# Patient Record
Sex: Female | Born: 1970 | Hispanic: Yes | Marital: Married | Smoking: Never smoker
Health system: Southern US, Community
[De-identification: ages and names within clinical notes are randomized; demographics above are authoritative.]

## PROBLEM LIST (undated history)

## (undated) DIAGNOSIS — I10 Essential (primary) hypertension: Secondary | ICD-10-CM

---

## 2003-12-10 ENCOUNTER — Other Ambulatory Visit: Payer: Self-pay

## 2009-10-05 ENCOUNTER — Emergency Department: Payer: Self-pay | Admitting: Emergency Medicine

## 2010-06-09 ENCOUNTER — Ambulatory Visit: Payer: Self-pay | Admitting: Internal Medicine

## 2010-07-12 ENCOUNTER — Ambulatory Visit: Payer: Self-pay | Admitting: Internal Medicine

## 2011-07-05 ENCOUNTER — Ambulatory Visit: Payer: Self-pay | Admitting: Internal Medicine

## 2011-07-21 ENCOUNTER — Ambulatory Visit: Payer: Self-pay | Admitting: Internal Medicine

## 2012-05-01 IMAGING — US US PELV - US TRANSVAGINAL
1 series · 17 of 25 positions shown · non-contrast
Comparison: none

REASON FOR EXAM: irregular menses
COMMENTS:

[Series 1: us pelv - us transvaginal · 17 of 112 slices shown]
[im 1/112]
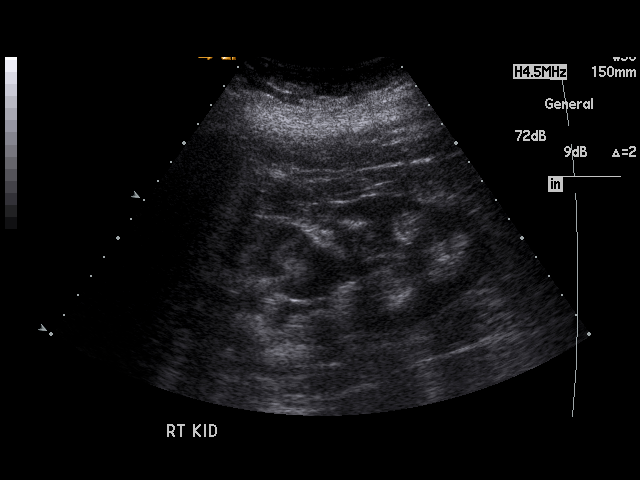
[im 10/112]
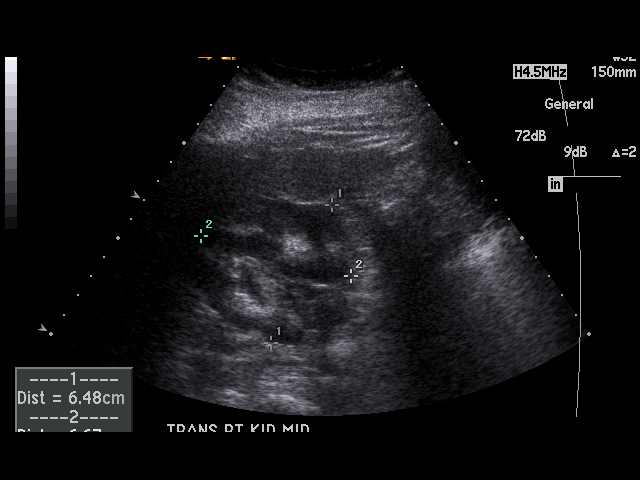
[im 14/112]
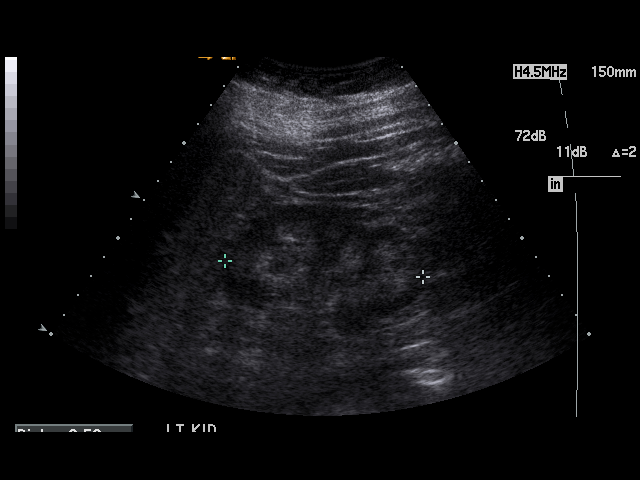
[im 24/112]
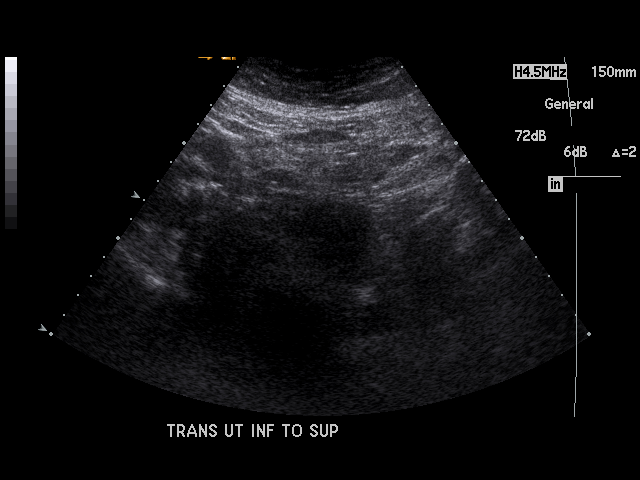
[im 28/112]
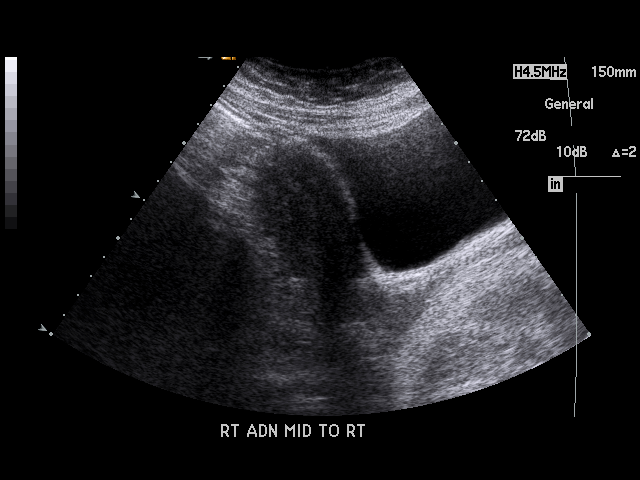
[im 38/112]
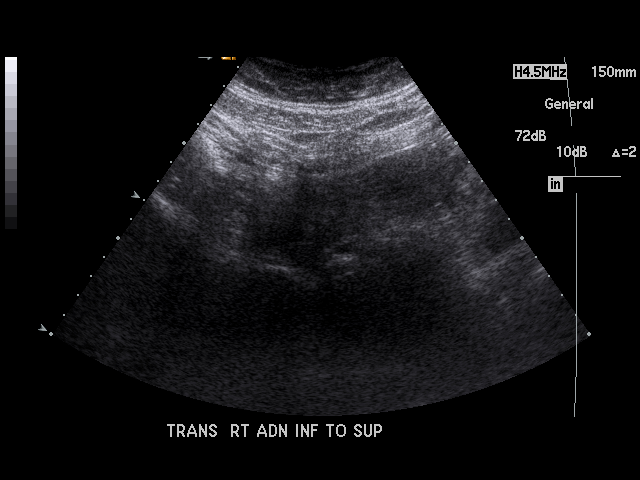
[im 42/112]
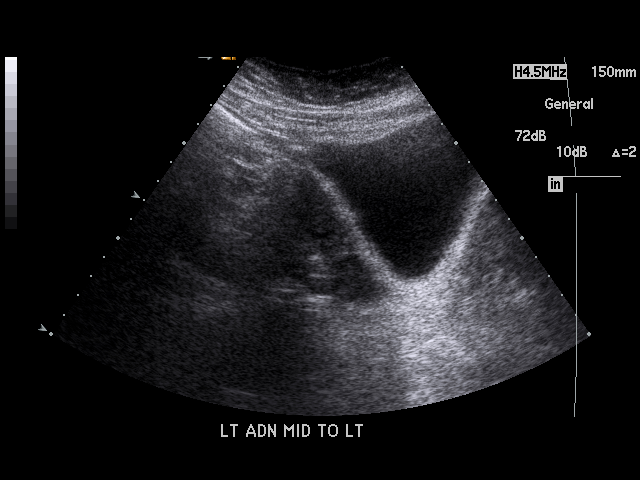
[im 51/112]
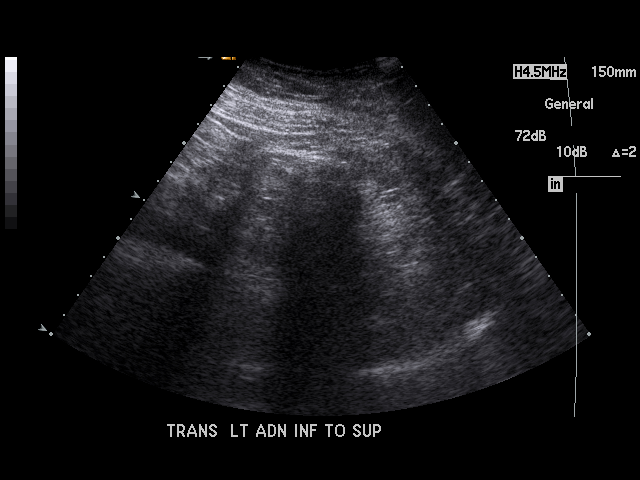
[im 56/112]
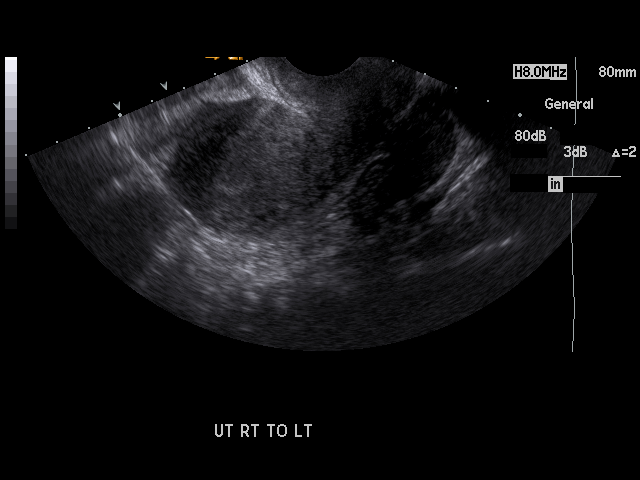
[im 61/112]
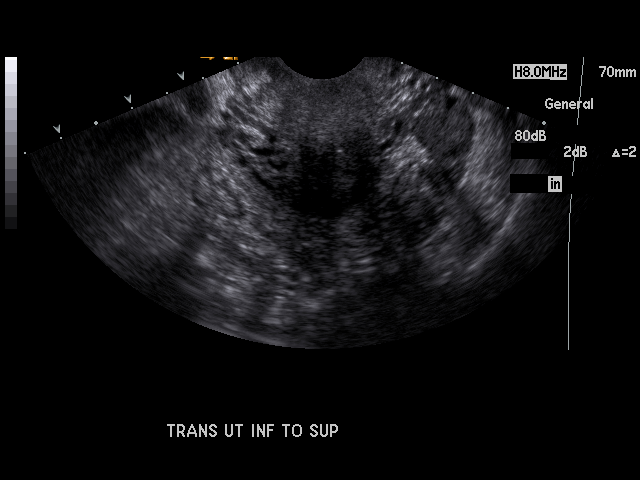
[im 70/112]
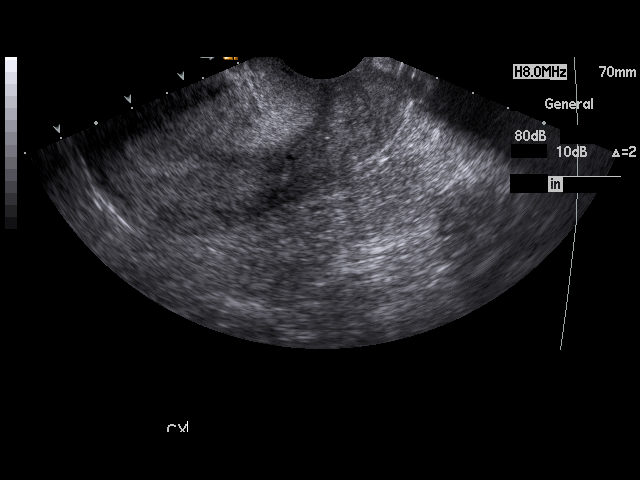
[im 75/112]
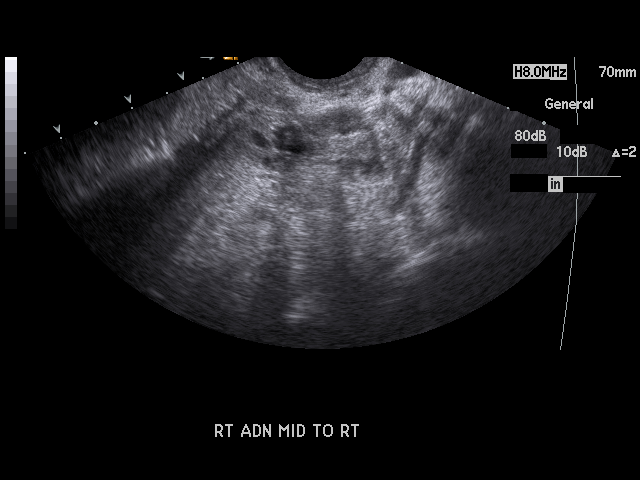
[im 84/112]
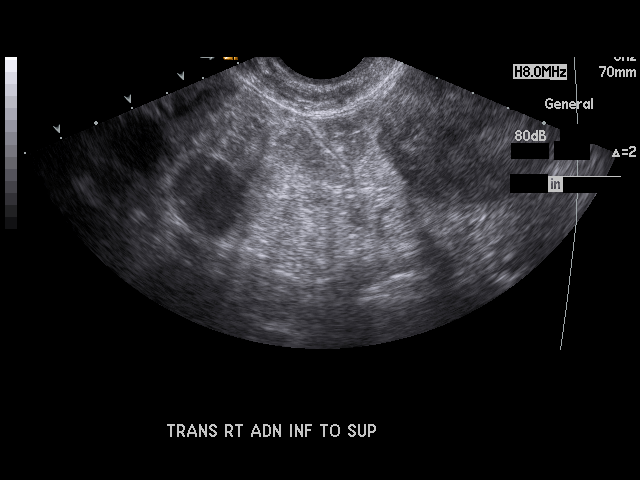
[im 88/112]
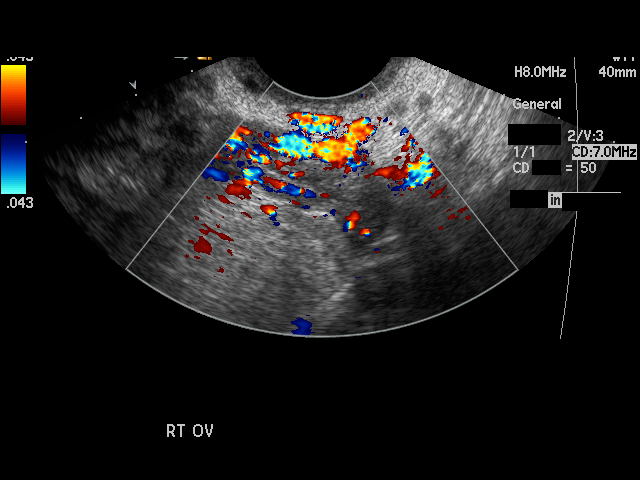
[im 98/112]
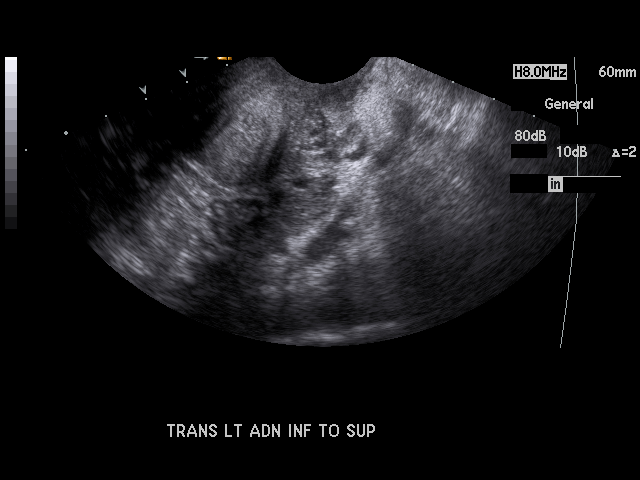
[im 102/112]
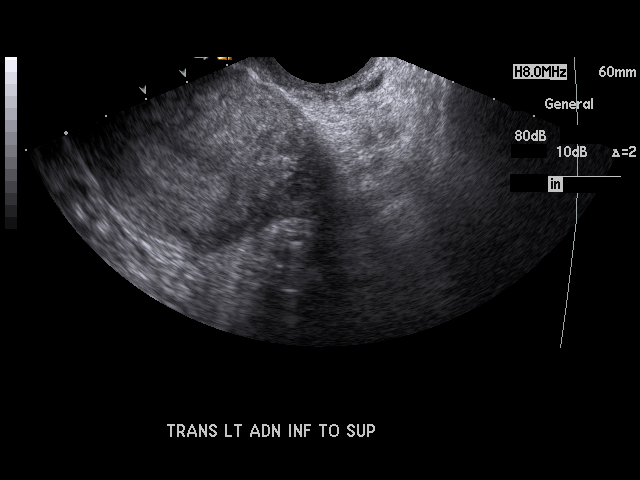
[im 112/112]
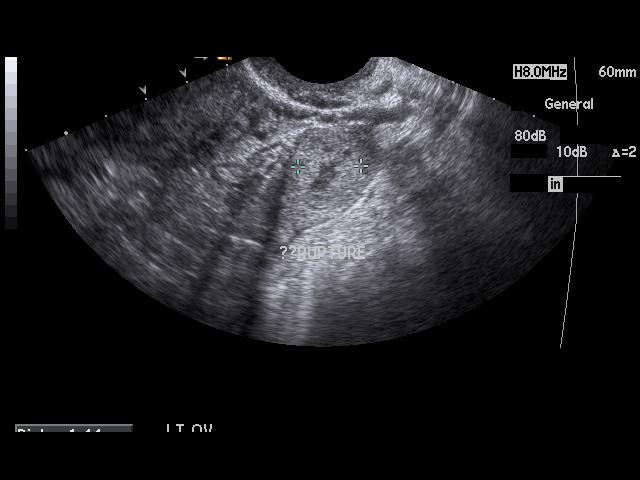

[17 of 25 positions shown; findings below may reference images not displayed]

PROCEDURE:     US  - US PELVIS EXAM W/TRANSVAGINAL  - July 21, 2011  [DATE]

RESULT:     Pelvic Sonogram shows the uterus measures 9.75 x 4.51 x 6.24 cm.
The endometrium measures 6.8 mm. The right ovary measures 2.9 x 1.4 x 2.0 cm
while the left measures 1.9 x 4.2 x 2.2 cm. The left ovary demonstrates an
irregularly marginated, hypoechoic area with increased flow peripherally
possibly representing a recently ruptured or hemorrhagic ruptured cyst. Both
kidneys show cortical thinning without obstruction. Correlate clinically.
IMPRESSION: 1.  Possibly ruptured cyst in the left ovary. Follow-up in 6 to 8 weeks
could be performed to document resolution.
2.  Probable chronic renal disease. Correlate clinically.
3.  Unremarkable uterus.

## 2012-12-10 ENCOUNTER — Ambulatory Visit: Payer: Self-pay | Admitting: Internal Medicine

## 2015-09-07 ENCOUNTER — Other Ambulatory Visit: Payer: Self-pay | Admitting: Internal Medicine

## 2015-09-07 DIAGNOSIS — Z1231 Encounter for screening mammogram for malignant neoplasm of breast: Secondary | ICD-10-CM

## 2015-09-16 ENCOUNTER — Ambulatory Visit: Payer: Self-pay

## 2015-09-16 ENCOUNTER — Ambulatory Visit
Admission: RE | Admit: 2015-09-16 | Discharge: 2015-09-16 | Disposition: A | Discharge status: Home / Self Care | Payer: BLUE CROSS/BLUE SHIELD | Source: Ambulatory Visit | Attending: Internal Medicine | Admitting: Internal Medicine

## 2015-09-16 DIAGNOSIS — Z1231 Encounter for screening mammogram for malignant neoplasm of breast: Secondary | ICD-10-CM | POA: Diagnosis not present

## 2016-10-27 ENCOUNTER — Other Ambulatory Visit: Payer: Self-pay | Admitting: Internal Medicine

## 2016-10-27 DIAGNOSIS — Z1231 Encounter for screening mammogram for malignant neoplasm of breast: Secondary | ICD-10-CM

## 2016-11-23 ENCOUNTER — Ambulatory Visit
Admission: RE | Admit: 2016-11-23 | Discharge: 2016-11-23 | Disposition: A | Discharge status: Home / Self Care | Payer: BLUE CROSS/BLUE SHIELD | Source: Ambulatory Visit | Attending: Internal Medicine | Admitting: Internal Medicine

## 2016-11-23 DIAGNOSIS — Z1231 Encounter for screening mammogram for malignant neoplasm of breast: Secondary | ICD-10-CM | POA: Diagnosis not present

## 2019-09-02 ENCOUNTER — Telehealth: Payer: Self-pay | Admitting: Obstetrics & Gynecology

## 2019-09-02 NOTE — Telephone Encounter (Signed)
Called and left voice mail for patient to call back to be schedule °

## 2019-09-03 NOTE — Telephone Encounter (Signed)
Pam James referring for BLT consultation. Voicemail not set up unable to leave message

## 2019-10-10 ENCOUNTER — Other Ambulatory Visit: Payer: Self-pay | Admitting: Internal Medicine

## 2019-10-10 DIAGNOSIS — Z1231 Encounter for screening mammogram for malignant neoplasm of breast: Secondary | ICD-10-CM

## 2019-10-29 ENCOUNTER — Ambulatory Visit
Admission: RE | Admit: 2019-10-29 | Discharge: 2019-10-29 | Disposition: A | Discharge status: Home / Self Care | Payer: BLUE CROSS/BLUE SHIELD | Source: Ambulatory Visit | Attending: Internal Medicine | Admitting: Internal Medicine

## 2019-10-29 DIAGNOSIS — Z1231 Encounter for screening mammogram for malignant neoplasm of breast: Secondary | ICD-10-CM | POA: Insufficient documentation

## 2019-12-11 ENCOUNTER — Other Ambulatory Visit: Payer: BLUE CROSS/BLUE SHIELD

## 2021-01-05 ENCOUNTER — Other Ambulatory Visit: Payer: Self-pay | Admitting: Internal Medicine

## 2021-01-18 ENCOUNTER — Other Ambulatory Visit: Payer: Self-pay | Admitting: Internal Medicine

## 2021-02-10 ENCOUNTER — Other Ambulatory Visit: Payer: Self-pay | Admitting: Internal Medicine

## 2021-02-10 DIAGNOSIS — Z1231 Encounter for screening mammogram for malignant neoplasm of breast: Secondary | ICD-10-CM

## 2021-05-06 ENCOUNTER — Other Ambulatory Visit: Payer: Self-pay | Admitting: Family Medicine

## 2021-05-06 DIAGNOSIS — Z1231 Encounter for screening mammogram for malignant neoplasm of breast: Secondary | ICD-10-CM

## 2021-05-18 ENCOUNTER — Other Ambulatory Visit: Payer: Self-pay

## 2021-05-18 ENCOUNTER — Ambulatory Visit
Admission: RE | Admit: 2021-05-18 | Discharge: 2021-05-18 | Disposition: A | Discharge status: Home / Self Care | Payer: BLUE CROSS/BLUE SHIELD | Source: Ambulatory Visit | Attending: Family Medicine | Admitting: Family Medicine

## 2021-05-18 DIAGNOSIS — Z1231 Encounter for screening mammogram for malignant neoplasm of breast: Secondary | ICD-10-CM | POA: Insufficient documentation

## 2022-02-27 IMAGING — MG MM DIGITAL SCREENING BILAT W/ TOMO AND CAD
8 series · 8 of 24 positions shown · non-contrast
Comparison: Previous exam(s).

CLINICAL DATA: Screening.

EXAM:
DIGITAL SCREENING BILATERAL MAMMOGRAM WITH TOMOSYNTHESIS AND CAD
TECHNIQUE: Bilateral screening digital craniocaudal and mediolateral oblique
mammograms were obtained. Bilateral screening digital breast
tomosynthesis was performed. The images were evaluated with
computer-aided detection.

[L CC synth-2D]
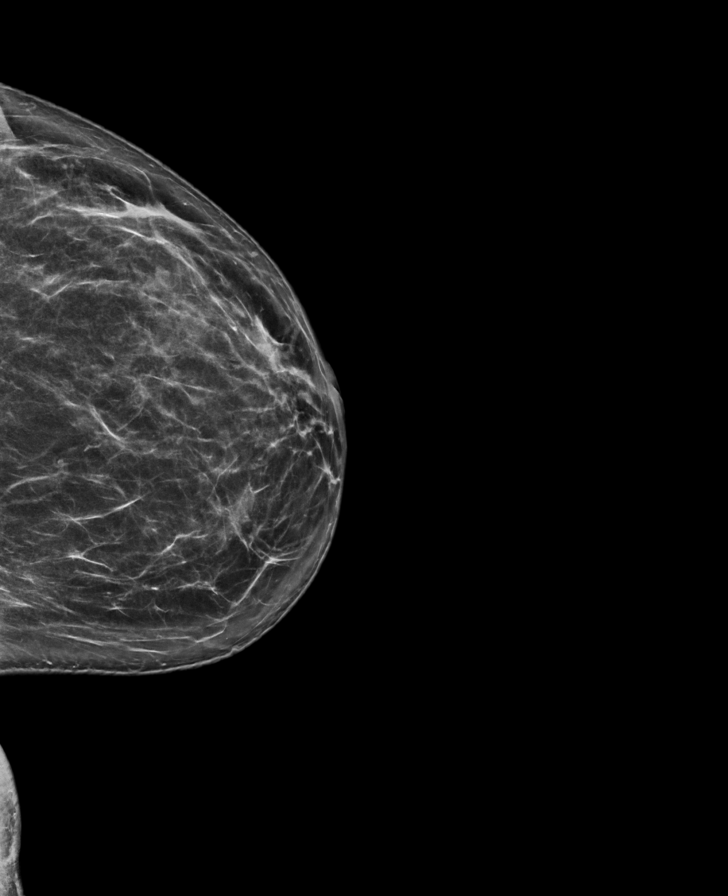

[L MLO synth-2D]
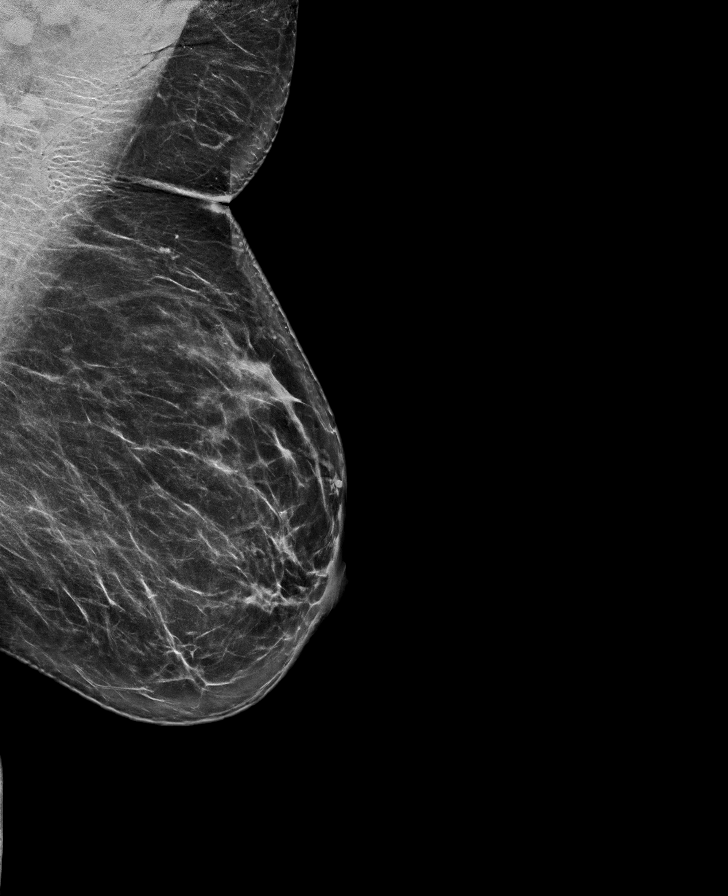

[R MLO synth-2D]
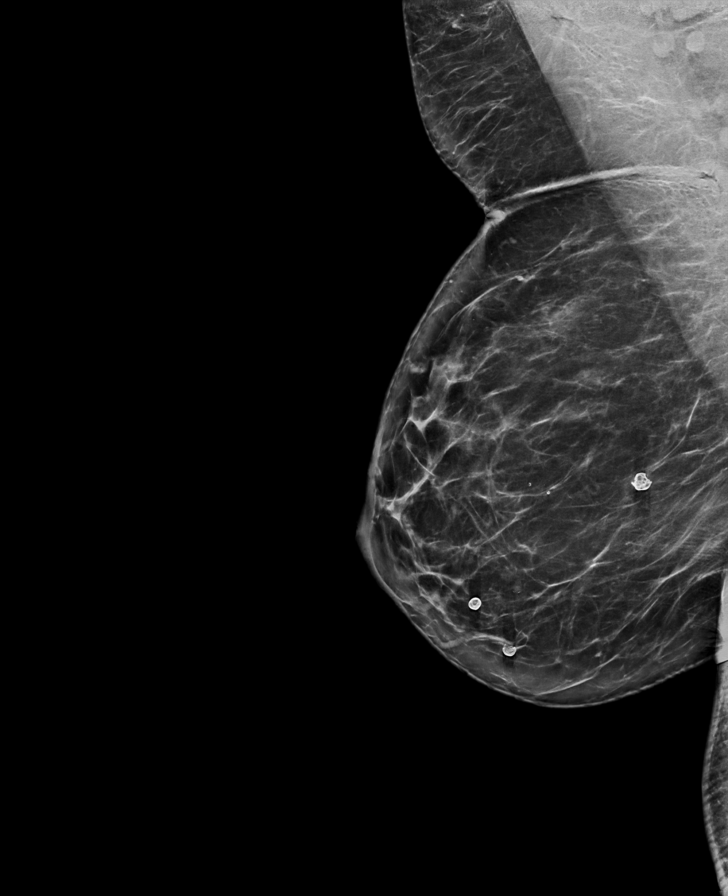

[R CC synth-2D]
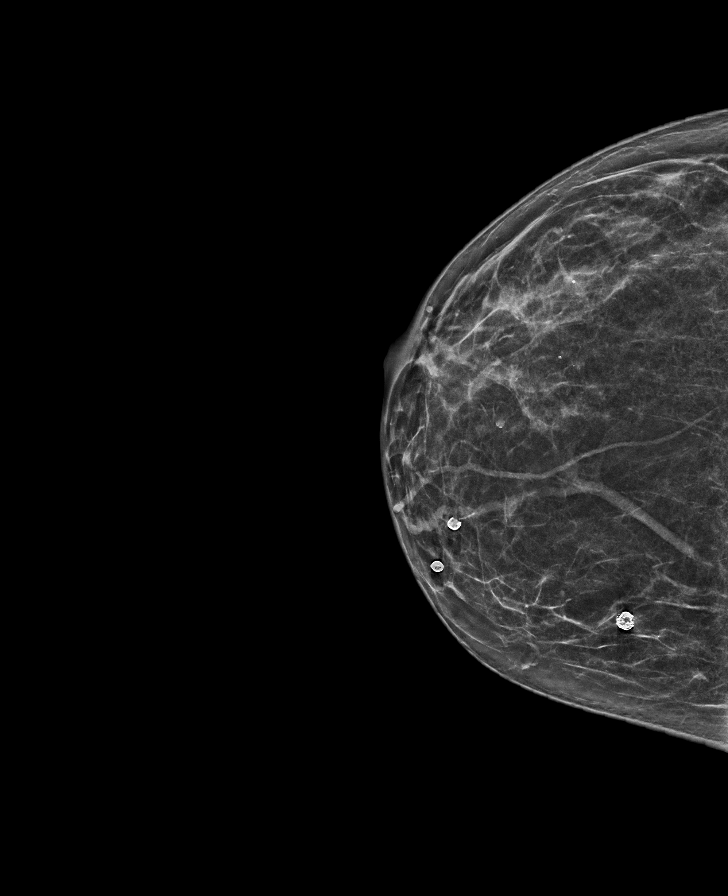

[R CC tomo · tomo slice 31/60.0]
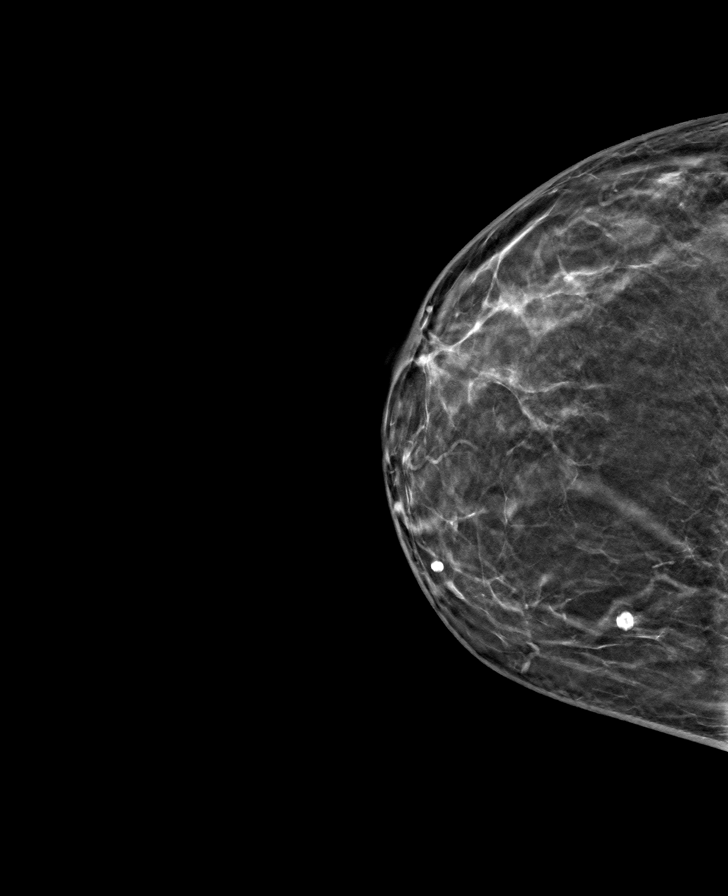

[R MLO tomo · tomo slice 41/80.0]
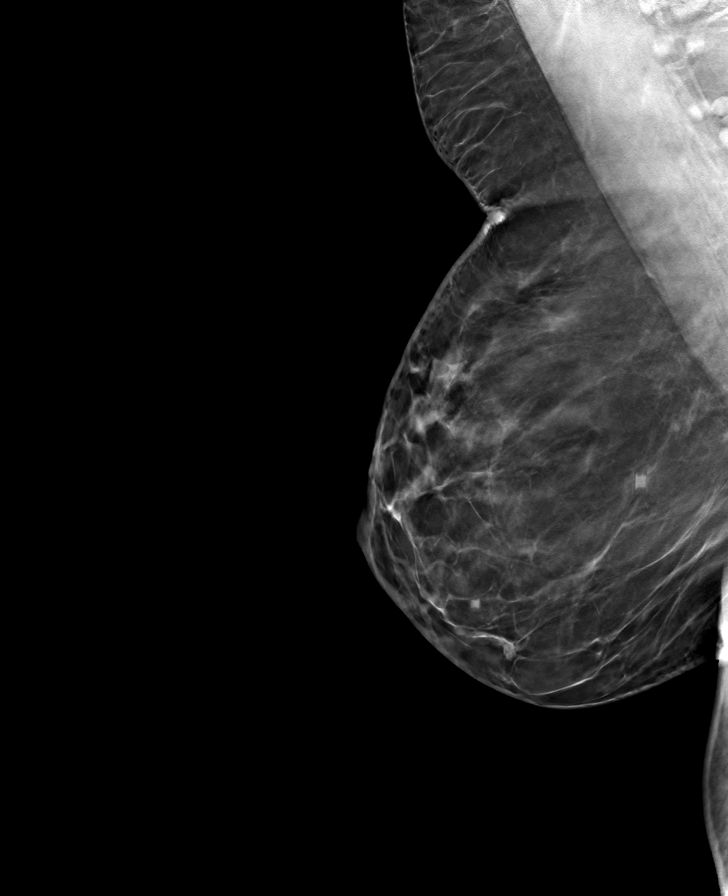

[L CC tomo · tomo slice 35/69.0]
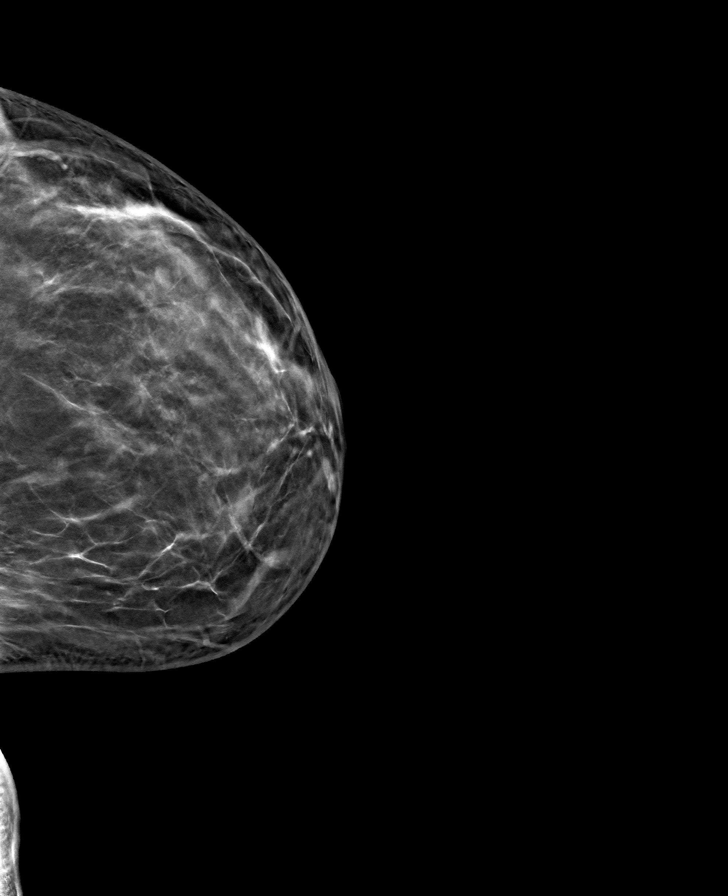

[L MLO tomo · tomo slice 41/82.0]
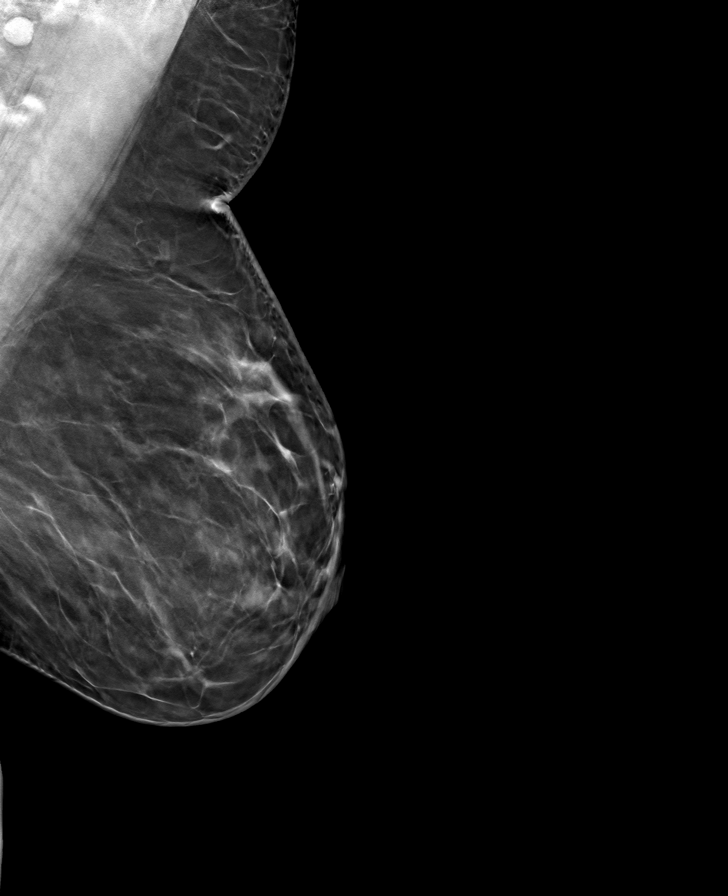

[8 of 24 positions shown; findings below may reference images not displayed]

ACR Breast Density Category b: There are scattered areas of
fibroglandular density.
FINDINGS: There are no findings suspicious for malignancy.
IMPRESSION: No mammographic evidence of malignancy. A result letter of this
screening mammogram will be mailed directly to the patient.

RECOMMENDATION:
Screening mammogram in one year. (Code:51-O-LD2)

BI-RADS CATEGORY  1: Negative.

## 2023-03-20 ENCOUNTER — Other Ambulatory Visit: Payer: Self-pay | Admitting: Primary Care

## 2023-03-20 DIAGNOSIS — Z1231 Encounter for screening mammogram for malignant neoplasm of breast: Secondary | ICD-10-CM

## 2023-03-23 ENCOUNTER — Ambulatory Visit
Admission: RE | Admit: 2023-03-23 | Discharge: 2023-03-23 | Disposition: A | Discharge status: Home / Self Care | Payer: BC Managed Care – PPO | Source: Ambulatory Visit | Attending: Primary Care | Admitting: Primary Care

## 2023-03-23 DIAGNOSIS — Z1231 Encounter for screening mammogram for malignant neoplasm of breast: Secondary | ICD-10-CM | POA: Insufficient documentation

## 2024-01-25 ENCOUNTER — Other Ambulatory Visit: Payer: Self-pay | Admitting: Family Medicine

## 2024-01-25 DIAGNOSIS — Z1231 Encounter for screening mammogram for malignant neoplasm of breast: Secondary | ICD-10-CM

## 2024-04-04 ENCOUNTER — Emergency Department

## 2024-04-04 ENCOUNTER — Other Ambulatory Visit: Payer: Self-pay

## 2024-04-04 ENCOUNTER — Emergency Department
Admission: EM | Admit: 2024-04-04 | Discharge: 2024-04-04 | Disposition: A | Discharge status: Home / Self Care | Attending: Emergency Medicine | Admitting: Emergency Medicine

## 2024-04-04 ENCOUNTER — Encounter: Payer: Self-pay | Admitting: Emergency Medicine

## 2024-04-04 DIAGNOSIS — R0789 Other chest pain: Secondary | ICD-10-CM | POA: Insufficient documentation

## 2024-04-04 DIAGNOSIS — I10 Essential (primary) hypertension: Secondary | ICD-10-CM | POA: Insufficient documentation

## 2024-04-04 DIAGNOSIS — R519 Headache, unspecified: Secondary | ICD-10-CM | POA: Diagnosis present

## 2024-04-04 HISTORY — DX: Essential (primary) hypertension: I10

## 2024-04-04 LAB — BASIC METABOLIC PANEL WITH GFR
Anion gap: 10 (ref 5–15)
BUN: 20 mg/dL (ref 6–20)
CO2: 24 mmol/L (ref 22–32)
Calcium: 8.8 mg/dL — ABNORMAL LOW (ref 8.9–10.3)
Chloride: 102 mmol/L (ref 98–111)
Creatinine, Ser: 0.82 mg/dL (ref 0.44–1.00)
GFR, Estimated: >60 mL/min (ref >60)
Glucose, Bld: 129 mg/dL — ABNORMAL HIGH (ref 70–99)
Potassium: 3.3 mmol/L — ABNORMAL LOW (ref 3.5–5.1)
Sodium: 136 mmol/L (ref 135–145)

## 2024-04-04 LAB — CBC
HCT: 37.9 % (ref 36.0–46.0)
Hemoglobin: 12.4 g/dL (ref 12.0–15.0)
MCH: 29 pg (ref 26.0–34.0)
MCHC: 32.7 g/dL (ref 30.0–36.0)
MCV: 88.6 fL (ref 80.0–100.0)
Platelets: 299 10*3/uL (ref 150–400)
RBC: 4.28 MIL/uL (ref 3.87–5.11)
RDW: 12.3 % (ref 11.5–15.5)
WBC: 9.9 10*3/uL (ref 4.0–10.5)
nRBC: 0 % (ref 0.0–0.2)

## 2024-04-04 LAB — TROPONIN I (HIGH SENSITIVITY): Troponin I (High Sensitivity): 9 ng/L (ref <18)

## 2024-04-04 NOTE — ED Notes (Signed)
Forbach MD at bedside. 

## 2024-04-04 NOTE — ED Triage Notes (Signed)
 Patient ambulatory to triage with steady gait, without difficulty or distress noted; pt reports elevated BP at home (173/100), generalized HA, mid CP and left arm "heaviness"; st seen by PCP recently for same and was given a referral to cardiology but not available til next month

## 2024-04-04 NOTE — ED Provider Notes (Signed)
 [facility] Provider Note    Event Date/Time   First MD Initiated Contact with Patient 04/04/24 0254     (approximate)   History   Hypertension  The patient and/or family speak(s) Spanish.  They understand they have the right to the use of a hospital interpreter, however at this time they prefer to speak directly with me in Spanish.  They know that they can ask for an interpreter at any time.   HPI Pam James is a 53 y.o. female with history of hypertension who presents with concerns about persistent hypertension and intermittent discomfort in her chest.  She is on 4 different blood pressure medications, and her doctor recently changed her off of amlodipine onto a different one.  She said that despite this her blood pressure is variable, sometimes as high as 190 and sometimes down in the 140s.  When the pressure gets high, she sometimes develops a headache and sometimes she feels like she can hear her pulse in her ears.  She does not have chest pain per se, but sometimes feels a little bit uncomfortable.  No shortness of breath.  No numbness or weakness in her extremities.  She has spoken with her primary care doctor and they referred her to cardiology at [facility], but her appointment is not for another month, and she was concerned something might need to be done right now.     Physical Exam   Triage Vital Signs: ED Triage Vitals  Encounter Vitals Group     BP 04/04/24 0231 (!) 178/70     Systolic BP Percentile --      Diastolic BP Percentile --      Pulse Rate 04/04/24 0231 92     Resp 04/04/24 0231 20     Temp 04/04/24 0231 98 F (36.7 C)     Temp Source 04/04/24 0231 Oral     SpO2 04/04/24 0231 99 %     Weight 04/04/24 0227 106.6 kg (235 lb)     Height 04/04/24 0227 1.702 m (5\' 7" )     Head Circumference --      Peak Flow --      Pain Score 04/04/24 0227 6     Pain Loc --      Pain Education --      Exclude from Growth Chart --      Most recent vital signs: Vitals:   04/04/24 0231 04/04/24 0300  BP: (!) 178/70 (!) 154/64  Pulse: 92   Resp: 20 16  Temp: 98 F (36.7 C)   SpO2: 99%     General: Awake, no distress.  Well-appearing, conversant. CV:  Good peripheral perfusion.  Normal heart sounds, regular rate and rhythm. Resp:  Normal effort. Speaking easily and comfortably, no accessory muscle usage nor intercostal retractions.  Lungs clear to auscultation bilaterally. Abd:  No distention.    ED Results / Procedures / Treatments   Labs (all labs ordered are listed, but only abnormal results are displayed) Labs Reviewed  BASIC METABOLIC PANEL WITH GFR - Abnormal; Notable for the following components:      Result Value   Potassium 3.3 (*)    Glucose, Bld 129 (*)    Calcium 8.8 (*)    All other components within normal limits  CBC  TROPONIN I (HIGH SENSITIVITY)     EKG  ED ECG REPORT I, Lynnda Sas, the attending physician, personally viewed and interpreted this ECG.  Date: 04/04/2024 EKG Time:  2:30 AM Rate: 89 Rhythm: normal sinus rhythm QRS Axis: normal Intervals: normal ST/T Wave abnormalities: normal Narrative Interpretation: no evidence of acute ischemia    RADIOLOGY I independently viewed and interpreted the patient's 1 view chest x-ray and I see no evidence of pneumonia, pneumothorax, nor wide mediastinum.  I also read the radiologist's report, which confirmed no acute findings.   PROCEDURES:  Critical Care performed: No  .1-3 Lead EKG Interpretation  Performed by: Lynnda Sas, MD Authorized by: Lynnda Sas, MD     Interpretation: normal     ECG rate:  90   ECG rate assessment: normal     Rhythm: sinus rhythm     Ectopy: none     Conduction: normal       IMPRESSION / MDM / ASSESSMENT AND PLAN / ED COURSE  I reviewed the triage vital signs and the nursing notes.                              Differential diagnosis includes, but is not limited to, primary  hypertension, anxiety, ACS, renal dysfunction.  Patient's presentation is most consistent with acute presentation with potential threat to life or bodily function.  Labs/studies ordered: EKG, chest x-ray, BMP, high-sensitivity troponin, CBC  Interventions/Medications given:  Medications - No data to display  (Note:  hospital course my include additional interventions and/or labs/studies not listed above.)   The patient is low risk for ACS based on HEA score and has not had any recent chest pain, one negative high-sensitivity troponin is appropriate.  Her Wells score for PE is 0.  Her main issue is concerned about her blood pressure.  Her labs are all reassuring as is her EKG and chest x-ray.  I had my usual and customary hypertension discussion with the patient.  She is not even uncontrolled at this time with a systolic down in the 140s and 409W.  She is reassured and comfortable with the plan for outpatient follow-up as scheduled, although I will also place a cardiology referral and see if this helps get her in sooner.  I gave my usual and customary return precautions.  The patient was on the cardiac monitor to evaluate for evidence of arrhythmia and/or significant heart rate changes.       FINAL CLINICAL IMPRESSION(S) / ED DIAGNOSES   Final diagnoses:  None     Rx / DC Orders   ED Discharge Orders     None        Note:  This document was prepared using Dragon voice recognition software and may include unintentional dictation errors.   Lynnda Sas, MD 04/04/24 [phone]

## 2024-04-04 NOTE — Discharge Instructions (Signed)
As we discussed, though you do have high blood pressure (hypertension), fortunately it is not immediately dangerous at this time and does not need emergency intervention or admission to the hospital.  If we add to or change your regular medications, we may cause more harm than good - it is more appropriate for your primary care doctor to evaluate you in clinic and decide if any medication changes are needed.  Please follow up in clinic as recommended in these papers.   ° °Return to the Emergency Department (ED) if you experience any worsening chest pain/pressure/tightness, difficulty breathing, or sudden sweating, or other symptoms that concern you. °

## 2024-04-04 NOTE — ED Notes (Signed)
 Pt presented to ED with c/o high blood pressure and dizziness ongoing for since 03/08/24 but worse tonight. States recently changed medication at that time to metoprolol. A&Ox4. Attached to cardiac monitor, bed in lowest position, call bell within reach.

## 2024-04-16 ENCOUNTER — Other Ambulatory Visit: Payer: Self-pay | Admitting: Family Medicine

## 2024-04-16 DIAGNOSIS — H9312 Tinnitus, left ear: Secondary | ICD-10-CM

## 2024-04-23 ENCOUNTER — Ambulatory Visit
Admission: RE | Admit: 2024-04-23 | Discharge: 2024-04-23 | Disposition: A | Discharge status: Home / Self Care | Source: Ambulatory Visit | Attending: Family Medicine | Admitting: Family Medicine

## 2024-04-23 DIAGNOSIS — H9312 Tinnitus, left ear: Secondary | ICD-10-CM | POA: Diagnosis present

## 2024-04-23 MED ORDER — GADOBUTROL 1 MMOL/ML IV SOLN
10.0000 mL | Freq: Once | INTRAVENOUS | Status: AC | PRN
  Administered 2024-04-23: 10 mL via INTRAVENOUS

## 2024-04-26 ENCOUNTER — Encounter: Payer: Self-pay | Admitting: Cardiology

## 2024-04-26 ENCOUNTER — Ambulatory Visit: Attending: Cardiology | Admitting: Cardiology

## 2024-04-26 VITALS — BP 140/72 | HR 64 | Ht 67.0 in | Wt 234.4 lb

## 2024-04-26 DIAGNOSIS — I10 Essential (primary) hypertension: Secondary | ICD-10-CM | POA: Diagnosis not present

## 2024-04-26 DIAGNOSIS — R002 Palpitations: Secondary | ICD-10-CM

## 2024-04-26 DIAGNOSIS — Z6836 Body mass index (BMI) 36.0-36.9, adult: Secondary | ICD-10-CM

## 2024-04-26 MED ORDER — CARVEDILOL 25 MG PO TABS: 25.0000 mg | ORAL_TABLET | Freq: Two times a day (BID) | ORAL | 3 refills | Status: AC

## 2024-04-26 NOTE — Progress Notes (Signed)
 Cardiology Office Note:    Date:  04/26/2024   ID:  Pam James, DOB 1970-12-11, MRN [id number]  PCP:  [facility] Providers Cardiologist:  None     Referring MD: Ricard Tawni KIDD, MD   Chief Complaint  Patient presents with   Establish Care    New pt has had complaints of chest pain palpitations also hears heart beat when she lays down to sleep , chest pressure  at times or  no SOB, medciation reviewed verbally with patient   Pam James is a 53 y.o. female who is being seen today for the evaluation of hypertension at the request of Cronk, Tawni KIDD, MD.   History of Present Illness:    Pam James is a 53 y.o. female with a hx of hypertension who presents due to hypertension and ringing in ears.  Patient has history of elevated BP, prescribed amlodipine, did not tolerate due to symptoms of palpitations or arrhythmias.  Amlodipine was stopped with improvement in symptoms although she still notes hearing her heart beating while sleeping.  There was some improvement with palpitations.  BP at home still 140 systolic.  Did not tolerate lisinopril due to cough.  Denies chest pain or shortness of breath.  Endorses eating high-calorie foods.  Past Medical History:  Diagnosis Date   Hypertension     History reviewed. No pertinent surgical history.  Current Medications: Current Meds  Medication Sig   carvedilol (COREG) 25 MG tablet Take 1 tablet (25 mg total) by mouth 2 (two) times daily.   cholecalciferol (VITAMIN D3) 25 MCG (1000 UNIT) tablet Take 1,000 Units by mouth daily.   cyanocobalamin (VITAMIN B12) 1000 MCG tablet Take 1,000 mcg by mouth daily.   ibuprofen (ADVIL) 800 MG tablet Take 200 mg by mouth as needed.   losartan-hydrochlorothiazide (HYZAAR) 100-25 MG tablet Take 1 tablet by mouth daily.   spironolactone (ALDACTONE) 25 MG tablet Take 25 mg by mouth every morning.   [DISCONTINUED] metoprolol succinate  (TOPROL-XL) 100 MG 24 hr tablet Take 100 mg by mouth daily.     Allergies:   Ace inhibitors and Amlodipine   Social History   Socioeconomic History   Marital status: Married    Spouse name: Not on file   Number of children: Not on file   Years of education: Not on file   Highest education level: Not on file  Occupational History   Not on file  Tobacco Use   Smoking status: Never   Smokeless tobacco: Never  Vaping Use   Vaping status: Never Used  Substance and Sexual Activity   Alcohol use: Never   Drug use: Never   Sexual activity: Not on file  Other Topics Concern   Not on file  Social History Narrative   Not on file   Social Drivers of Health   Financial Resource Strain: Not on file  Food Insecurity: Not on file  Transportation Needs: Not on file  Physical Activity: Not on file  Stress: Not on file  Social Connections: Not on file     Family History: The patient's family history is negative for Breast cancer.  ROS:   Please see the history of present illness.     All other systems reviewed and are negative.  EKGs/Labs/Other Studies Reviewed:    The following studies were reviewed today:  EKG Interpretation Date/Time:  Friday April 26 2024 08:55:01 EDT Ventricular Rate:  64 PR Interval:  124 QRS Duration:  84 QT Interval:  412 QTC Calculation: 425 R Axis:   26  Text Interpretation: Normal sinus rhythm Normal ECG Confirmed by Darliss Rogue ([id number]) on 04/26/2024 8:57:55 AM    Recent Labs: 04/04/2024: BUN 20; Creatinine, Ser 0.82; Hemoglobin 12.4; Platelets 299; Potassium 3.3; Sodium 136  Recent Lipid Panel No results found for: CHOL, TRIG, HDL, CHOLHDL, VLDL, LDLCALC, LDLDIRECT   Risk Assessment/Calculations:          Physical Exam:    VS:  BP (!) 140/72 (BP Location: Left Arm, Patient Position: Sitting, Cuff Size: Normal)   Pulse 64   Ht 5' 7 (1.702 m)   Wt 234 lb 6.4 oz (106.3 kg)   LMP 08/28/2015 (Within Days)   SpO2 97%    BMI 36.71 kg/m     Wt Readings from Last 3 Encounters:  04/26/24 234 lb 6.4 oz (106.3 kg)  04/04/24 235 lb (106.6 kg)     GEN:  Well nourished, well developed in no acute distress HEENT: Normal NECK: No JVD; No carotid bruits CARDIAC: RRR, no murmurs, rubs, gallops RESPIRATORY:  Clear to auscultation without rales, wheezing or rhonchi  ABDOMEN: Soft, non-tender, non-distended MUSCULOSKELETAL:  No edema; No deformity  SKIN: Warm and dry NEUROLOGIC:  Alert and oriented x 3 PSYCHIATRIC:  Normal affect   ASSESSMENT:    1. Primary hypertension   2. Palpitations   3. BMI 36.0-36.9,adult    PLAN:    In order of problems listed above:  Hypertension, BP elevated.  Stop Toprol-XL, start Coreg 25 mg twice daily.  Continue losartan-HCTZ 100-25 mg daily, Aldactone 25 mg daily.  Low-salt diet advised.  Obtain renal arterial ultrasound to rule out RAS. Palpitations, improved with stopping Norvasc.  Monitor with addition of Coreg as above.  Also has ringing in ears, workup as per PCP. Obesity, low-calorie diet, weight loss, increase activity advised.  Follow-up after echo      Medication Adjustments/Labs and Tests Ordered: Current medicines are reviewed at length with the patient today.  Concerns regarding medicines are outlined above.  Orders Placed This Encounter  Procedures   EKG 12-Lead   VAS US  RENAL ARTERY DUPLEX   Meds ordered this encounter  Medications   carvedilol (COREG) 25 MG tablet    Sig: Take 1 tablet (25 mg total) by mouth 2 (two) times daily.    Dispense:  180 tablet    Refill:  3    Patient Instructions  Medication Instructions:  Your physician recommends the following medication changes.  STOP TAKING: Metoprolol   START TAKING: Carvedilol 25 mg twice a day   *If you need a refill on your cardiac medications before your next appointment, please call your pharmacy*  Lab Work: No labs ordered today  If you have labs (blood work) drawn today and  your tests are completely normal, you will receive your results only by: MyChart Message (if you have MyChart) OR A paper copy in the mail If you have any lab test that is abnormal or we need to change your treatment, we will call you to review the results.  Testing/Procedures: Your physician has requested that you have a renal artery duplex. During this test, an ultrasound is used to evaluate blood flow to the kidneys. Take your medications as you usually do. This will take place at [address] [facility] [address]) [address], [place] [address].  No food after 11PM the night before.  Water is OK. (Don't drink liquids if you have  been instructed not to for ANOTHER test). Avoid foods that produce bowel gas, for 24 hours prior to exam (see below). No breakfast, no chewing gum, no smoking or carbonated beverages. Patient may take morning medications with water. Come in for test at least 15 minutes early to register.  Please note: We ask at that you not bring children with you during ultrasound (echo/ vascular) testing. Due to room size and safety concerns, children are not allowed in the ultrasound rooms during exams. Our front office staff cannot provide observation of children in our lobby area while testing is being conducted. An adult accompanying a patient to their appointment will only be allowed in the ultrasound room at the discretion of the ultrasound technician under special circumstances. We apologize for any inconvenience.   Follow-Up: At [facility], you and your health needs are our priority.  As part of our continuing mission to provide you with exceptional heart care, our providers are all part of one team.  This team includes your primary Cardiologist (physician) and Advanced Practice Providers or APPs (Physician Assistants and Nurse Practitioners) who all work together to provide you with the care you need, when you need it.  Your next appointment:   2  month(s)  Provider:   You may see Dr. Darliss or one of the following Advanced Practice Providers on your designated Care Team:   Lonni Meager, NP Lesley Maffucci, PA-C Bernardino Bring, PA-C Cadence [place], PA-C Tylene Lunch, NP Barnie Hila, NP    We recommend signing up for the patient portal called MyChart.  Sign up information is provided on this After Visit Summary.  MyChart is used to connect with patients for Virtual Visits (Telemedicine).  Patients are able to view lab/test results, encounter notes, upcoming appointments, etc.  Non-urgent messages can be sent to your provider as well.   To learn more about what you can do with MyChart, go to [email or web address].         Signed, Redell Darliss, MD  04/26/2024 9:49 AM     [facility]

## 2024-04-26 NOTE — Patient Instructions (Signed)
 Medication Instructions:  Your physician recommends the following medication changes.  STOP TAKING: Metoprolol   START TAKING: Carvedilol 25 mg twice a day   *If you need a refill on your cardiac medications before your next appointment, please call your pharmacy*  Lab Work: No labs ordered today  If you have labs (blood work) drawn today and your tests are completely normal, you will receive your results only by: MyChart Message (if you have MyChart) OR A paper copy in the mail If you have any lab test that is abnormal or we need to change your treatment, we will call you to review the results.  Testing/Procedures: Your physician has requested that you have a renal artery duplex. During this test, an ultrasound is used to evaluate blood flow to the kidneys. Take your medications as you usually do. This will take place at [address] [facility] [address]) [address], [place] [address].  No food after 11PM the night before.  Water is OK. (Don't drink liquids if you have been instructed not to for ANOTHER test). Avoid foods that produce bowel gas, for 24 hours prior to exam (see below). No breakfast, no chewing gum, no smoking or carbonated beverages. Patient may take morning medications with water. Come in for test at least 15 minutes early to register.  Please note: We ask at that you not bring children with you during ultrasound (echo/ vascular) testing. Due to room size and safety concerns, children are not allowed in the ultrasound rooms during exams. Our front office staff cannot provide observation of children in our lobby area while testing is being conducted. An adult accompanying a patient to their appointment will only be allowed in the ultrasound room at the discretion of the ultrasound technician under special circumstances. We apologize for any inconvenience.   Follow-Up: At [facility], you and your health needs are our priority.  As part of our continuing  mission to provide you with exceptional heart care, our providers are all part of one team.  This team includes your primary Cardiologist (physician) and Advanced Practice Providers or APPs (Physician Assistants and Nurse Practitioners) who all work together to provide you with the care you need, when you need it.  Your next appointment:   2 month(s)  Provider:   You may see Dr. Darliss or one of the following Advanced Practice Providers on your designated Care Team:   Lonni Meager, NP Lesley Maffucci, PA-C Bernardino Bring, PA-C Cadence [place], PA-C Tylene Lunch, NP Barnie Hila, NP    We recommend signing up for the patient portal called MyChart.  Sign up information is provided on this After Visit Summary.  MyChart is used to connect with patients for Virtual Visits (Telemedicine).  Patients are able to view lab/test results, encounter notes, upcoming appointments, etc.  Non-urgent messages can be sent to your provider as well.   To learn more about what you can do with MyChart, go to [email or web address].

## 2024-05-06 ENCOUNTER — Ambulatory Visit: Payer: Self-pay | Admitting: Cardiology

## 2024-05-06 ENCOUNTER — Ambulatory Visit: Attending: Cardiology

## 2024-05-06 DIAGNOSIS — I10 Essential (primary) hypertension: Secondary | ICD-10-CM

## 2024-05-06 DIAGNOSIS — R002 Palpitations: Secondary | ICD-10-CM

## 2024-05-15 ENCOUNTER — Ambulatory Visit
Admission: RE | Admit: 2024-05-15 | Discharge: 2024-05-15 | Disposition: A | Discharge status: Home / Self Care | Source: Ambulatory Visit | Attending: Family Medicine | Admitting: Family Medicine

## 2024-05-15 DIAGNOSIS — Z1231 Encounter for screening mammogram for malignant neoplasm of breast: Secondary | ICD-10-CM | POA: Insufficient documentation

## 2024-05-17 ENCOUNTER — Ambulatory Visit: Admitting: Cardiology

## 2024-06-26 ENCOUNTER — Encounter: Payer: Self-pay | Admitting: Cardiology

## 2024-06-26 ENCOUNTER — Ambulatory Visit: Attending: Cardiology | Admitting: Cardiology

## 2024-06-26 VITALS — BP 122/72 | HR 66 | Ht 67.0 in | Wt 233.0 lb

## 2024-06-26 DIAGNOSIS — Z6836 Body mass index (BMI) 36.0-36.9, adult: Secondary | ICD-10-CM | POA: Diagnosis not present

## 2024-06-26 DIAGNOSIS — R002 Palpitations: Secondary | ICD-10-CM

## 2024-06-26 DIAGNOSIS — I10 Essential (primary) hypertension: Secondary | ICD-10-CM

## 2024-06-26 NOTE — Progress Notes (Signed)
 Cardiology Office Note:    Date:  06/26/2024   ID:  Pam James, DOB 1971/03/10, MRN [id number]  PCP:  [facility] Providers Cardiologist:  None     Referring MD: [facility]   Chief Complaint  Patient presents with   Follow-up    1 month follow up after renal artery duplex,  pt has been doing well with no complaints of chest pain, chest pressure or SOB, medciation reviewed verbally with patient    History of Present Illness:    Pam James is a 53 y.o. female with a hx of hypertension who presents for follow-up.  Previously seen due to hypertension and ringing in ears.  Toprol-XL was stopped, carvedilol  started.  Renal arterial ultrasound obtained to evaluate any significant stenosis.  Tolerating BP meds as prescribed.  Feels okay has no concerns at this time.  Denies chest pain or shortness of breath.   Past Medical History:  Diagnosis Date   Hypertension     No past surgical history on file.  Current Medications: Current Meds  Medication Sig   carvedilol  (COREG ) 25 MG tablet Take 1 tablet (25 mg total) by mouth 2 (two) times daily.   cholecalciferol (VITAMIN D3) 25 MCG (1000 UNIT) tablet Take 1,000 Units by mouth daily.   cyanocobalamin (VITAMIN B12) 1000 MCG tablet Take 1,000 mcg by mouth daily.   ibuprofen (ADVIL) 800 MG tablet Take 200 mg by mouth as needed.   losartan-hydrochlorothiazide (HYZAAR) 100-25 MG tablet Take 1 tablet by mouth daily.   spironolactone (ALDACTONE) 25 MG tablet Take 25 mg by mouth every morning.     Allergies:   Ace inhibitors and Amlodipine   Social History   Socioeconomic History   Marital status: Married    Spouse name: Not on file   Number of children: Not on file   Years of education: Not on file   Highest education level: Not on file  Occupational History   Not on file  Tobacco Use   Smoking status: Never   Smokeless tobacco: Never  Vaping Use   Vaping  status: Never Used  Substance and Sexual Activity   Alcohol use: Never   Drug use: Never   Sexual activity: Not on file  Other Topics Concern   Not on file  Social History Narrative   Not on file   Social Drivers of Health   Financial Resource Strain: Not on file  Food Insecurity: Not on file  Transportation Needs: Not on file  Physical Activity: Not on file  Stress: Not on file  Social Connections: Not on file     Family History: The patient's family history is negative for Breast cancer.  ROS:   Please see the history of present illness.     All other systems reviewed and are negative.  EKGs/Labs/Other Studies Reviewed:    The following studies were reviewed today:       Recent Labs: 04/04/2024: BUN 20; Creatinine, Ser 0.82; Hemoglobin 12.4; Platelets 299; Potassium 3.3; Sodium 136  Recent Lipid Panel No results found for: CHOL, TRIG, HDL, CHOLHDL, VLDL, LDLCALC, LDLDIRECT   Risk Assessment/Calculations:          Physical Exam:    VS:  BP 122/72 (BP Location: Left Arm, Patient Position: Sitting, Cuff Size: Normal)   Pulse 66   Ht 5' 7 (1.702 m)   Wt 233 lb (105.7 kg)   LMP 08/28/2015 (Within Days)  SpO2 99%   BMI 36.49 kg/m     Wt Readings from Last 3 Encounters:  06/26/24 233 lb (105.7 kg)  04/26/24 234 lb 6.4 oz (106.3 kg)  04/04/24 235 lb (106.6 kg)     GEN:  Well nourished, well developed in no acute distress HEENT: Normal NECK: No JVD; No carotid bruits CARDIAC: RRR, no murmurs, rubs, gallops RESPIRATORY:  Clear to auscultation without rales, wheezing or rhonchi  ABDOMEN: Soft, non-tender, non-distended MUSCULOSKELETAL:  No edema; No deformity  SKIN: Warm and dry NEUROLOGIC:  Alert and oriented x 3 PSYCHIATRIC:  Normal affect   ASSESSMENT:    1. Primary hypertension   2. Palpitations   3. BMI 36.0-36.9,adult    PLAN:    In order of problems listed above:  Hypertension, now controlled.  Continue Coreg  25 mg twice  daily, losartan-HCTZ 100-25 mg daily, Aldactone 25 mg daily.  renal arterial ultrasound showed no significant RAS.  Continue low-salt diet. Palpitations, improved with stopping Norvasc.  No indication for additional workup at this time.. Obesity, low-calorie diet, weight loss, advised.  Follow-up as needed.      Medication Adjustments/Labs and Tests Ordered: Current medicines are reviewed at length with the patient today.  Concerns regarding medicines are outlined above.  No orders of the defined types were placed in this encounter.  No orders of the defined types were placed in this encounter.   Patient Instructions  Medication Instructions:  Your physician recommends that you continue on your current medications as directed. Please refer to the Current Medication list given to you today.   *If you need a refill on your cardiac medications before your next appointment, please call your pharmacy*  Lab Work: No labs ordered today  If you have labs (blood work) drawn today and your tests are completely normal, you will receive your results only by: MyChart Message (if you have MyChart) OR A paper copy in the mail If you have any lab test that is abnormal or we need to change your treatment, we will call you to review the results.  Testing/Procedures: No test ordered today   Follow-Up: At [facility], you and your health needs are our priority.  As part of our continuing mission to provide you with exceptional heart care, our providers are all part of one team.  This team includes your primary Cardiologist (physician) and Advanced Practice Providers or APPs (Physician Assistants and Nurse Practitioners) who all work together to provide you with the care you need, when you need it.  Your next appointment:   As needed         Signed, Redell Cave, MD  06/26/2024 10:56 AM    [facility]

## 2024-06-26 NOTE — Patient Instructions (Signed)
